# Patient Record
Sex: Male | Born: 1960 | Hispanic: Yes | Marital: Married | Smoking: Never smoker
Health system: Southern US, Community
[De-identification: ages and names within clinical notes are randomized; demographics above are authoritative.]

---

## 2013-05-06 ENCOUNTER — Inpatient Hospital Stay: Payer: Self-pay | Admitting: Internal Medicine

## 2013-05-06 LAB — URINALYSIS, COMPLETE
BILIRUBIN, UR: NEGATIVE
Glucose,UR: NEGATIVE mg/dL (ref 0–75)
KETONE: NEGATIVE
NITRITE: NEGATIVE
Ph: 5 (ref 4.5–8.0)
Protein: 100
SPECIFIC GRAVITY: 1.019 (ref 1.003–1.030)
WBC UR: 1742 /HPF (ref 0–5)

## 2013-05-06 LAB — CBC WITH DIFFERENTIAL/PLATELET
BASOS ABS: 0 10*3/uL (ref 0.0–0.1)
Basophil %: 0.2 %
Eosinophil #: 0 10*3/uL (ref 0.0–0.7)
Eosinophil %: 0.1 %
HCT: 54.9 % — AB (ref 40.0–52.0)
HGB: 18.5 g/dL — AB (ref 13.0–18.0)
Lymphocyte #: 1.4 10*3/uL (ref 1.0–3.6)
Lymphocyte %: 10.3 %
MCH: 29.6 pg (ref 26.0–34.0)
MCHC: 33.8 g/dL (ref 32.0–36.0)
MCV: 88 fL (ref 80–100)
MONOS PCT: 2.2 %
Monocyte #: 0.3 x10 3/mm (ref 0.2–1.0)
Neutrophil #: 11.6 10*3/uL — ABNORMAL HIGH (ref 1.4–6.5)
Neutrophil %: 87.2 %
Platelet: 130 10*3/uL — ABNORMAL LOW (ref 150–440)
RBC: 6.26 10*6/uL — ABNORMAL HIGH (ref 4.40–5.90)
RDW: 13 % (ref 11.5–14.5)
WBC: 13.3 10*3/uL — AB (ref 3.8–10.6)

## 2013-05-06 LAB — CLOSTRIDIUM DIFFICILE(ARMC)

## 2013-05-06 LAB — CK TOTAL AND CKMB (NOT AT ARMC): CK, TOTAL: 66 U/L

## 2013-05-06 LAB — COMPREHENSIVE METABOLIC PANEL
ALBUMIN: 3.8 g/dL (ref 3.4–5.0)
ALK PHOS: 106 U/L
AST: 27 U/L (ref 15–37)
Anion Gap: 11 (ref 7–16)
BUN: 16 mg/dL (ref 7–18)
Bilirubin,Total: 3.4 mg/dL — ABNORMAL HIGH (ref 0.2–1.0)
Calcium, Total: 9 mg/dL (ref 8.5–10.1)
Chloride: 101 mmol/L (ref 98–107)
Co2: 19 mmol/L — ABNORMAL LOW (ref 21–32)
Creatinine: 1.5 mg/dL — ABNORMAL HIGH (ref 0.60–1.30)
EGFR (Non-African Amer.): 53 — ABNORMAL LOW
Glucose: 171 mg/dL — ABNORMAL HIGH (ref 65–99)
OSMOLALITY: 268 (ref 275–301)
POTASSIUM: 3.6 mmol/L (ref 3.5–5.1)
SGPT (ALT): 50 U/L (ref 12–78)
Sodium: 131 mmol/L — ABNORMAL LOW (ref 136–145)
TOTAL PROTEIN: 8.5 g/dL — AB (ref 6.4–8.2)

## 2013-05-06 LAB — PROTIME-INR
INR: 1.2
Prothrombin Time: 14.7 secs (ref 11.5–14.7)

## 2013-05-06 LAB — MAGNESIUM: MAGNESIUM: 1.3 mg/dL — AB

## 2013-05-06 LAB — TROPONIN I

## 2013-05-06 LAB — OCCULT BLOOD X 1 CARD TO LAB, STOOL: OCCULT BLOOD, FECES: NEGATIVE

## 2013-05-06 LAB — HEMOGLOBIN A1C: Hemoglobin A1C: 5.5 % (ref 4.2–6.3)

## 2013-05-07 LAB — CBC WITH DIFFERENTIAL/PLATELET
BASOS ABS: 0 10*3/uL (ref 0.0–0.1)
BASOS PCT: 0.3 %
EOS ABS: 0 10*3/uL (ref 0.0–0.7)
Eosinophil %: 0.1 %
HCT: 42.6 % (ref 40.0–52.0)
HGB: 15 g/dL (ref 13.0–18.0)
Lymphocyte #: 1.1 10*3/uL (ref 1.0–3.6)
Lymphocyte %: 8.7 %
MCH: 30.9 pg (ref 26.0–34.0)
MCHC: 35.3 g/dL (ref 32.0–36.0)
MCV: 88 fL (ref 80–100)
MONO ABS: 0.6 x10 3/mm (ref 0.2–1.0)
Monocyte %: 5 %
NEUTROS PCT: 85.9 %
Neutrophil #: 10.8 10*3/uL — ABNORMAL HIGH (ref 1.4–6.5)
Platelet: 76 10*3/uL — ABNORMAL LOW (ref 150–440)
RBC: 4.86 10*6/uL (ref 4.40–5.90)
RDW: 13 % (ref 11.5–14.5)
WBC: 12.5 10*3/uL — AB (ref 3.8–10.6)

## 2013-05-07 LAB — BASIC METABOLIC PANEL
Anion Gap: 9 (ref 7–16)
BUN: 9 mg/dL (ref 7–18)
Calcium, Total: 7.7 mg/dL — ABNORMAL LOW (ref 8.5–10.1)
Chloride: 108 mmol/L — ABNORMAL HIGH (ref 98–107)
Co2: 18 mmol/L — ABNORMAL LOW (ref 21–32)
Creatinine: 0.94 mg/dL (ref 0.60–1.30)
EGFR (Non-African Amer.): >60
GLUCOSE: 98 mg/dL (ref 65–99)
OSMOLALITY: 269 (ref 275–301)
Potassium: 3.6 mmol/L (ref 3.5–5.1)
SODIUM: 135 mmol/L — AB (ref 136–145)

## 2013-05-07 LAB — MAGNESIUM: MAGNESIUM: 1.9 mg/dL

## 2013-05-07 LAB — WBCS, STOOL

## 2013-05-08 LAB — URINE CULTURE

## 2013-05-08 LAB — PLATELET COUNT: Platelet: 75 10*3/uL — ABNORMAL LOW (ref 150–440)

## 2013-05-09 LAB — CULTURE, BLOOD (SINGLE)

## 2013-05-10 LAB — CULTURE, BLOOD (SINGLE)

## 2013-05-10 LAB — STOOL CULTURE

## 2013-05-12 LAB — CULTURE, BLOOD (SINGLE)

## 2013-07-22 LAB — CBC AND DIFFERENTIAL
HCT: 50 % (ref 41–53)
HEMOGLOBIN: 17.2 g/dL (ref 13.5–17.5)
NEUTROS ABS: 3 /uL
Platelets: 233 10*3/uL (ref 150–399)
WBC: 6.3 10^3/mL

## 2013-11-10 ENCOUNTER — Ambulatory Visit: Payer: Self-pay | Admitting: Chiropractic Medicine

## 2014-07-04 NOTE — Discharge Summary (Signed)
PATIENT NAME:  Tana FeltsBARRERA SEGURA, Markese MR#:  [id number] DATE OF BIRTH:  07-Feb-1961  DATE OF ADMISSION:  05/06/2013 DATE OF DISCHARGE:  05/09/2013  ADMISSION DIAGNOSIS:  Diarrhea.   DISCHARGE DIAGNOSES: 1.  Sepsis secondary to Enterobacter bacteremia and urinary tract infection.  2.  Urinary tract infection.  3.  Diarrhea, resolved.  Negative Clostridium difficile.  4.  Hyperglycemia, resolved.  5.  Thrombocytopenia secondary to sepsis.  PERTINENT LABORATORY:   1.  Urine culture is positive for Enterobacter.  2.  Blood culture is Positive for Enterobacter.  3.  Discharge white blood cell count 12.5, hemoglobin 15, hematocrit 42, platelets 75.    HOSPITAL COURSE:  A 54 year old male who was admitted for diarrhea, found to have bacteremia.  For further details, please refer to the H and P.  1.  Sepsis secondary to Enterobacter bacteremia presumed from his urinary tract infection.  Urinalysis and urine culture was positive for Enterobacter.  His blood cultures that were obtained on admission were also positive for Enterobacter.  The patient was started on broad-spectrum antibiotics.  His Enterobacter is actually sensitive to Levaquin, so he will be discharged with Levaquin.  2.  UTI.  The patient was started on Levaquin and Zosyn.  His urine culture was positive for Enterobacter.  His renal ultrasound was negative.  3.  Diarrhea.  C. diff. was negative.  This is resolved.  4.  Acute renal failure secondary to dehydration, improved.  His renal ultrasound was negative for hydronephrosis.  5.  Hyperglycemia.  On admission his A1c was 5.5.  6.  Thrombocytopenia, likely secondary to severe sepsis.  His platelets should be followed up as an outpatient.   DISCHARGE MEDICATIONS:  Levaquin 500 mg daily for 11 days.   DISCHARGE DIET:  Regular diet.   DISCHARGE ACTIVITY:  As tolerated.   DISCHARGE FOLLOW-UP:  The patient will need to follow up with [facility].  He already has this scheduled  next week.  The patient will need a repeat CBC.   TIME SPENT:  35 minutes.   The patient is medically stable for discharge.   ____________________________ Aissatou Fronczak P. Juliene PinaMody, MD spm:ea D: 05/09/2013 13:36:04 ET T: 05/09/2013 15:21:54 ET JOB#: [id number]  cc: Dondi Aime P. Juliene PinaMody, MD, <Dictator> [facility] Perel Hauschild P Elisabet Gutzmer MD ELECTRONICALLY SIGNED 05/11/2013 21:16

## 2014-07-04 NOTE — H&P (Signed)
PATIENT NAME:  William Potts, William Potts MR#:  [id number] DATE OF BIRTH:  10-Jun-1960  DATE OF ADMISSION:  05/06/2013  PRIMARY CARE PHYSICIAN: None.   CHIEF COMPLAINT: Diarrhea.   HISTORY OF PRESENT ILLNESS:  This is a very pleasant 54 year old male with no past medical history who presents with diarrhea. The patient says since yesterday he has had a lot of diarrhea, almost every half hour. No blood in the diarrhea. No nausea or vomiting. He has had fever and chills as well. He also acknowledges polyuria and frequency. In the ER, he was hypotensive, has responded with 3 liters of fluid. His systolic blood pressure is now 96. He continues to have ongoing diarrhea.   REVIEW OF SYSTEMS: CONSTITUTIONAL: Positive fever. Positive weakness. Positive chills. Positive fatigue.  EYES:  No blurred or double vision.  No glaucoma or cataracts.   EARS, NOSE, THROAT: No ear pain, hearing loss, tinnitus, seasonal allergies.   RESPIRATORY:  No coughing, wheezing, hemoptysis, COPD.  CARDIOVASCULAR:  No chest pain, orthopnea, edema, arrhythmia, dyspnea on exertion, palpitations.  GASTROINTESTINAL:  No nausea, vomiting. Positive diarrhea. No abdominal pain, melena or ulcers.  GENITOURINARY: No dysuria. Positive frequency and polyuria.  ENDOCRINE: Positive polyuria. No nocturia. No thyroid problems.  HEMATOLOGIC AND LYMPHATIC: No anemia or easy bruising.  SKIN: No rash or lesions.  MUSCULOSKELETAL:  No back pain, no limited activity.  NEUROLOGIC:  No history of CVA, TIA or seizure.  PSYCHIATRIC: No history of anxiety or depression  PAST MEDICAL HISTORY:  None.   ALLERGIES: No known drug allergies.   MEDICATIONS:  None.  PAST SURGICAL HISTORY: None.   SOCIAL HISTORY:  No tobacco, alcohol or IV drug.   FAMILY HISTORY:  No history of hypertension or CAD.   PHYSICAL EXAMINATION: VITAL SIGNS: Temperature 102.9, pulse is 126, respirations 22, blood pressure 84/64, is now 96/72, 95% on room air.  GENERAL:  The patient is alert, oriented, does not appear in acute distress.  HEENT;  Head is atraumatic. Pupils are round and reactive. Sclerae anicteric.  Mucous membranes are very dry. Oropharynx is clear.  NECK: Supple without JVD, carotid bruit or enlarged thyroid.  CARDIOVASCULAR: Tachycardia without murmur, gallops or rubs. PMI is not displaced.  LUNGS: Clear to auscultation without crackles, rales, rhonchi or wheezing. Normal to percussion.   ABDOMEN: Bowel sounds are positive. Nontender. No rebound or guarding. No hepatosplenomegaly.  EXTREMITIES:  No clubbing, cyanosis or edema.   NEUROLOGIC:  Cranial nerves II through XII are intact with no focal deficits.   SKIN:  Without rash or lesion.    LABORATORY, DIAGNOSTIC AND RADIOLOGICAL DATA:  1.  Sodium 131, potassium 3.6, chloride 101, bicarb 19, BUN 15, creatinine 1.50, glucose 171, calcium 9.0, bilirubin 3.4, alk phos 106, ALT 50, AST 27, total protein 8.5, albumin 3.8, magnesium 1.3.  2.  White blood cells 13.3, hemoglobin 18.5, hematocrit 55, platelets 130.  3.  Occult feces is negative.  4.  Urinalysis shows 3+ LCE with 1742 white blood cells, 35 red blood cells.  5.  Chest x-ray shows no acute cardiopulmonary disease.   ASSESSMENT AND PLAN: A 54 year old male who presents with profuse diarrhea.  1.  Sepsis.  The patient is tachycardiac, has leukocytosis, a fever. Sepsis is likely secondary to his diarrhea and/or urinary tract infection. We will treat with antibiotics, IV fluids and monitor heart rate, blood pressure. Blood cultures and urine cultures have been ordered in the ER.   2.  Diarrhea. Clostridium difficile  cultures have been ordered.  I have empirically placed the patient on Rocephin and Flagyl. I have chosen Rocephin because the patient has a urinary tract infection as well and I think it Rocephin is a better agent for a urinary tract infection.  3.  Acute renal failure secondary to dehydration secondary to diarrhea and sepsis. We  will provide IV fluids, monitor the creatinine.  4.  Hyponatremia from dehydration from his diarrhea. Will provide IV fluids. Recheck a sodium in the a.m.  5.  Hyperglycemia. The patient may have undiagnosed diabetes as he is complaining of polyuria. Will go ahead and check a hemoglobin A1c. Further management after the A1c is back.  6.  Hypomagnesemia. Will replete and recheck in the a.m.  7.  The patient will need a primary care physician prior to discharge.   CRITICAL CARE TIME SPENT ON THIS PATIENT: Is 45 minutes.   ____________________________ Donell Beers. Benjie Karvonen, MD spm:cs D: 05/06/2013 14:35:32 ET T: 05/06/2013 14:54:02 ET JOB#: [id number]  cc: Deshanta Lady P. Benjie Karvonen, MD, <Dictator> Donell Beers Pj Zehner MD ELECTRONICALLY SIGNED 05/06/2013 17:45

## 2015-02-05 IMAGING — US US RENAL KIDNEY
1 series · 14 of 25 positions shown · non-contrast
Comparison: None.

CLINICAL DATA: UTI

EXAM:
RENAL/URINARY TRACT ULTRASOUND COMPLETE

[Series 1: us renal kidney · 0.27mm/px · 14 of 45 slices shown]
[im 1/45]
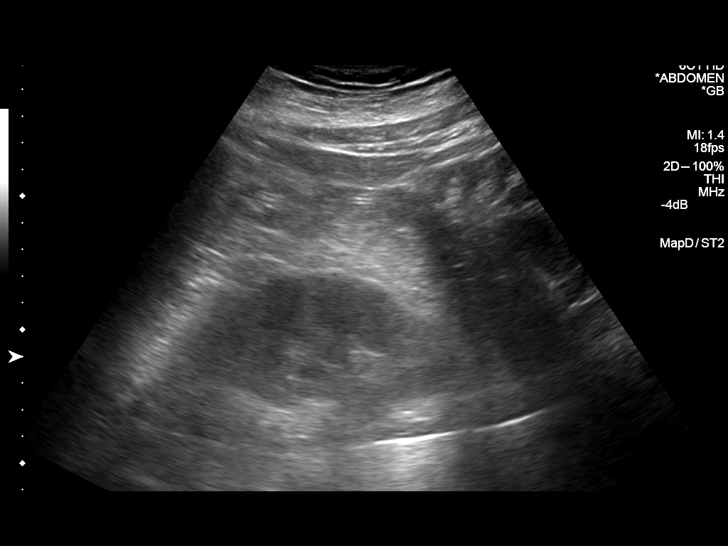
[im 4/45]
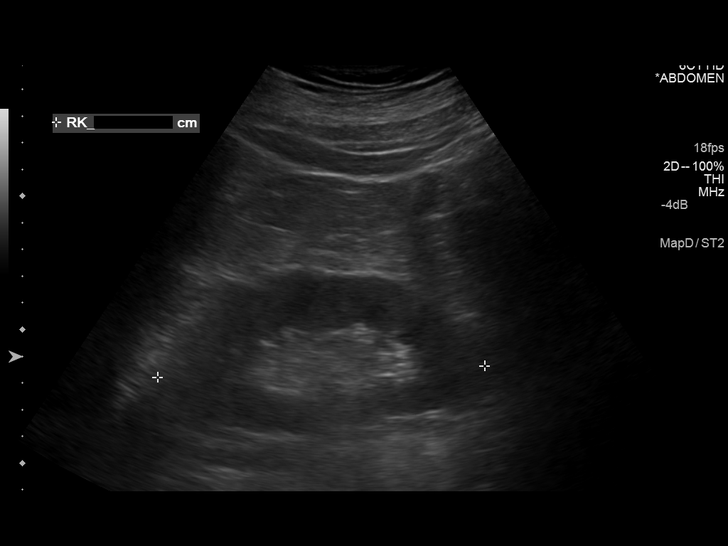
[im 8/45]
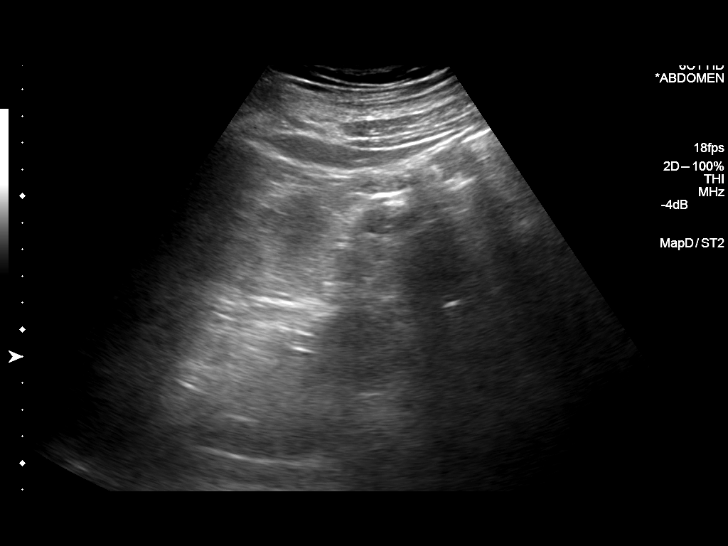
[im 12/45]
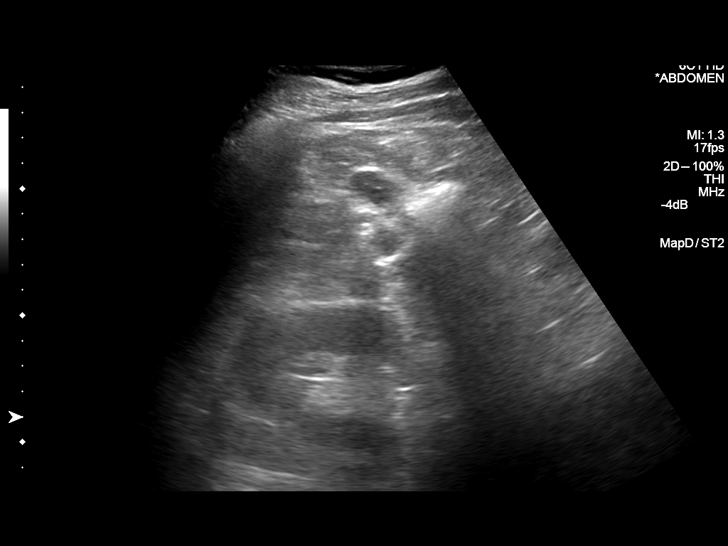
[im 15/45]
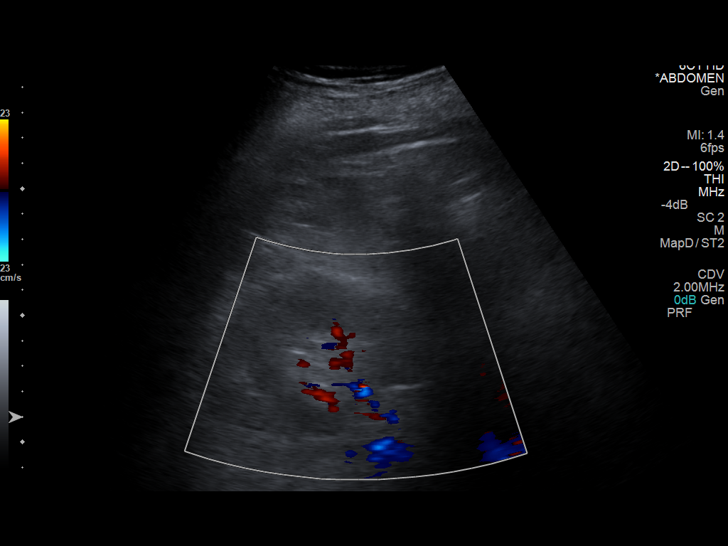
[im 17/45]
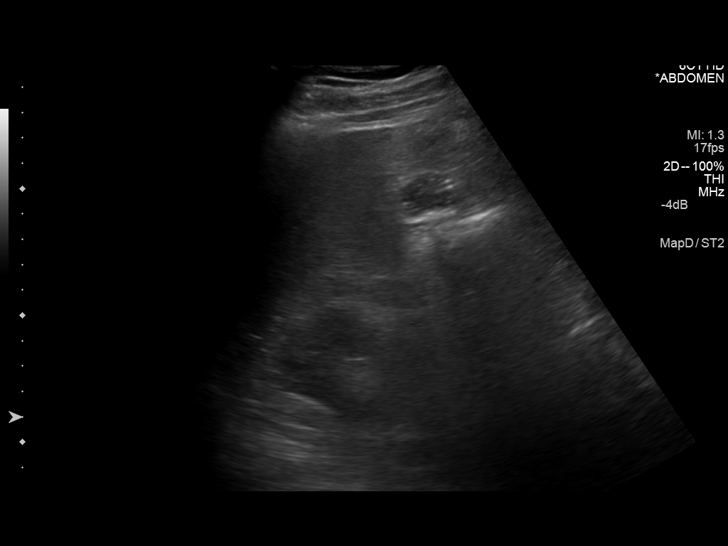
[im 21/45]
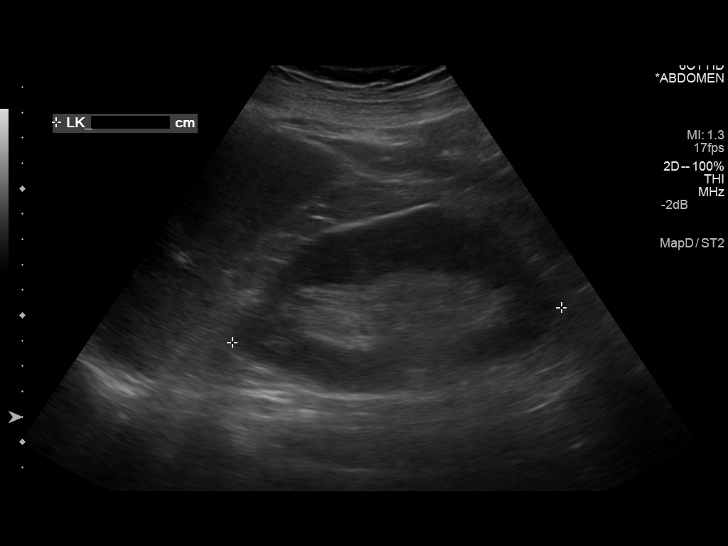
[im 24/45]
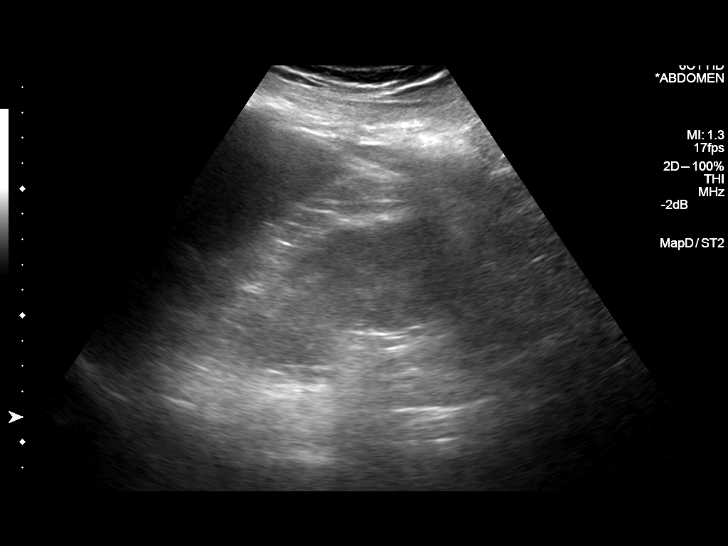
[im 28/45]
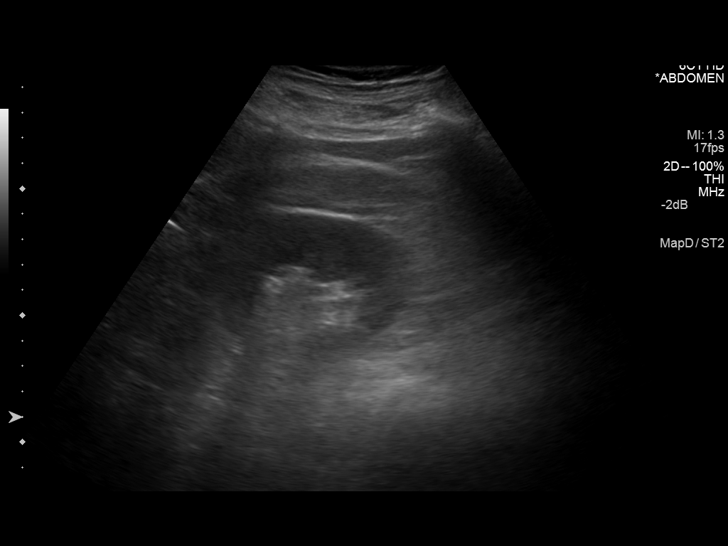
[im 30/45]
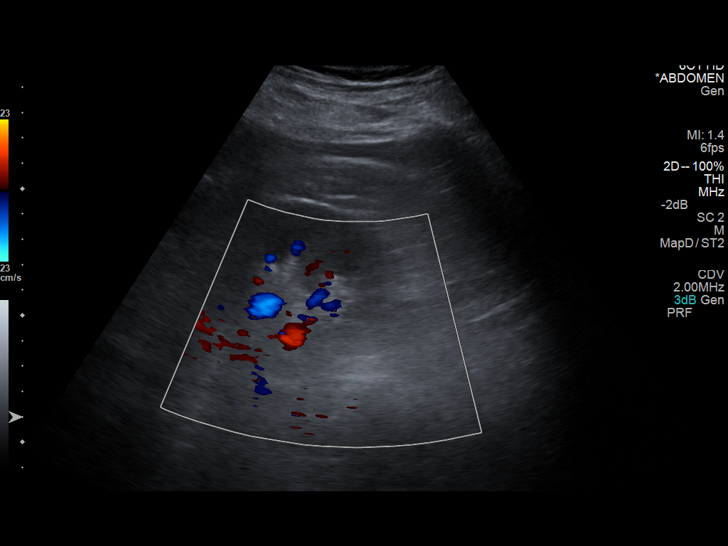
[im 34/45]
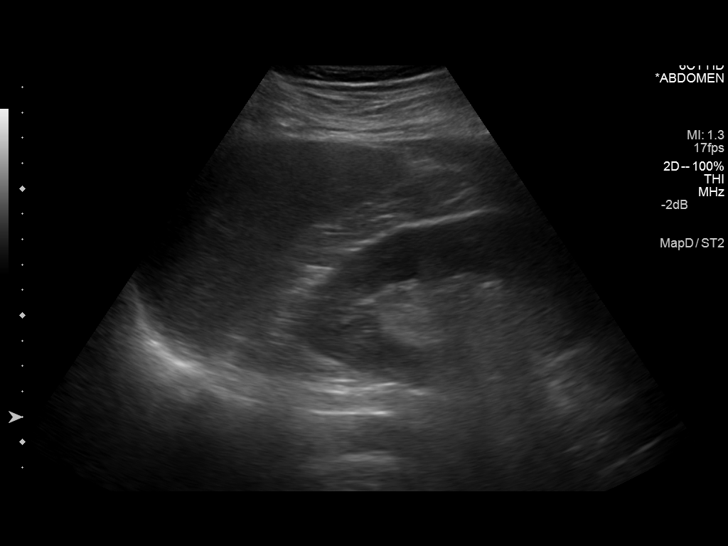
[im 37/45]
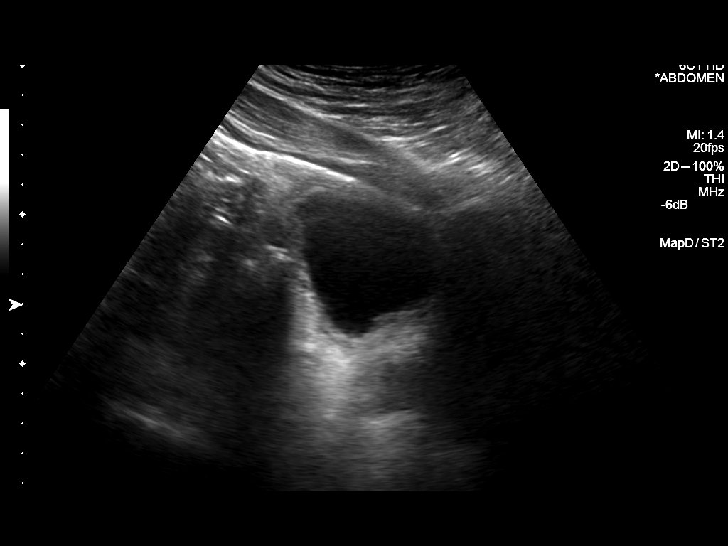
[im 41/45]
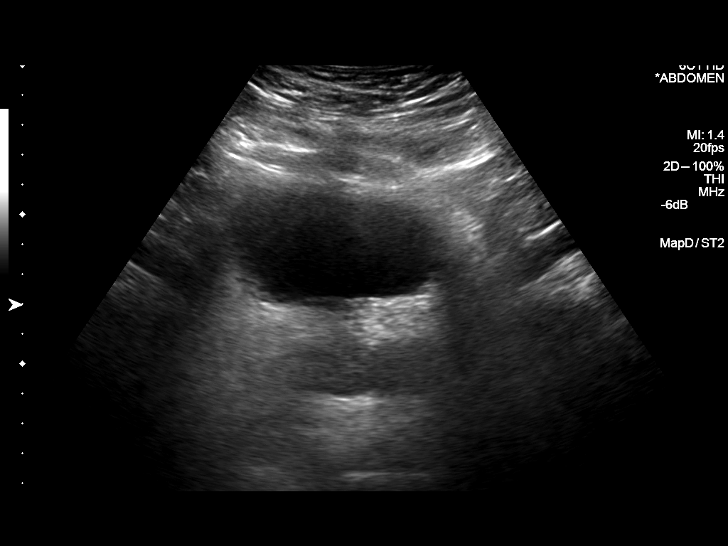
[im 45/45]
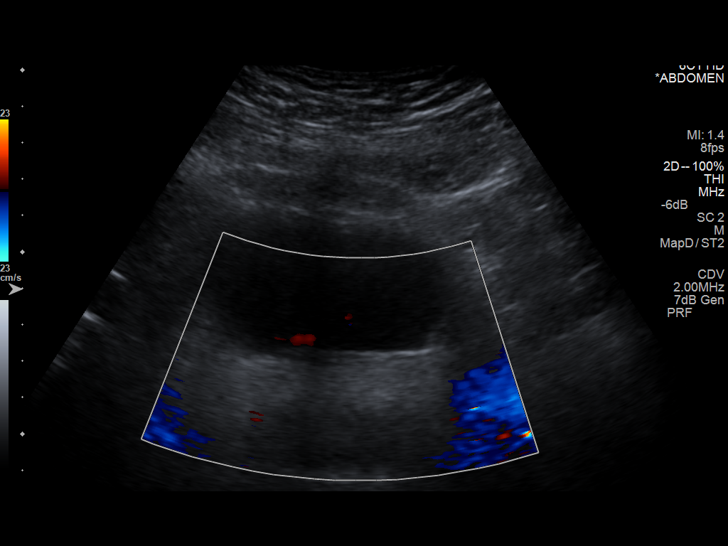

[14 of 25 positions shown; findings below may reference images not displayed]

FINDINGS: Right Kidney:

Length: 12.3 cm.  No mass or hydronephrosis.

Left Kidney:

Length: 13.1 cm.  No mass or hydronephrosis.

Bladder:

Mildly thick-walled but underdistended.
IMPRESSION: Negative renal ultrasound.

## 2019-08-20 ENCOUNTER — Encounter: Payer: Self-pay | Admitting: Emergency Medicine

## 2019-08-20 ENCOUNTER — Emergency Department
Admission: EM | Admit: 2019-08-20 | Discharge: 2019-08-20 | Disposition: A | Discharge status: Home / Self Care | Payer: Self-pay | Attending: Emergency Medicine | Admitting: Emergency Medicine

## 2019-08-20 ENCOUNTER — Other Ambulatory Visit: Payer: Self-pay

## 2019-08-20 ENCOUNTER — Emergency Department: Payer: Self-pay

## 2019-08-20 DIAGNOSIS — Y929 Unspecified place or not applicable: Secondary | ICD-10-CM | POA: Insufficient documentation

## 2019-08-20 DIAGNOSIS — Y939 Activity, unspecified: Secondary | ICD-10-CM | POA: Insufficient documentation

## 2019-08-20 DIAGNOSIS — S61012A Laceration without foreign body of left thumb without damage to nail, initial encounter: Secondary | ICD-10-CM | POA: Insufficient documentation

## 2019-08-20 DIAGNOSIS — W312XXA Contact with powered woodworking and forming machines, initial encounter: Secondary | ICD-10-CM | POA: Insufficient documentation

## 2019-08-20 DIAGNOSIS — S62522B Displaced fracture of distal phalanx of left thumb, initial encounter for open fracture: Secondary | ICD-10-CM | POA: Insufficient documentation

## 2019-08-20 DIAGNOSIS — Y999 Unspecified external cause status: Secondary | ICD-10-CM | POA: Insufficient documentation

## 2019-08-20 MED ORDER — CEPHALEXIN 500 MG PO CAPS: 500.0000 mg | ORAL_CAPSULE | Freq: Three times a day (TID) | ORAL | 0 refills | Status: AC

## 2019-08-20 MED ORDER — OXYCODONE-ACETAMINOPHEN 7.5-325 MG PO TABS: 1.0000 | ORAL_TABLET | Freq: Four times a day (QID) | ORAL | 0 refills | Status: AC | PRN

## 2019-08-20 MED ORDER — OXYCODONE-ACETAMINOPHEN 5-325 MG PO TABS
1.0000 | ORAL_TABLET | Freq: Once | ORAL | Status: AC
  Administered 2019-08-20: 1 via ORAL
  Filled 2019-08-20: qty 1

## 2019-08-20 MED ORDER — BUPIVACAINE HCL (PF) 0.5 % IJ SOLN
20.0000 mL | Freq: Once | INTRAMUSCULAR | Status: AC
  Administered 2019-08-20: 20 mL
  Filled 2019-08-20: qty 20

## 2019-08-20 MED ORDER — ONDANSETRON 4 MG PO TBDP
4.0000 mg | ORAL_TABLET | Freq: Once | ORAL | Status: AC
  Administered 2019-08-20: 4 mg via ORAL
  Filled 2019-08-20: qty 1

## 2019-08-20 MED ORDER — CEFAZOLIN SODIUM-DEXTROSE 1-4 GM/50ML-% IV SOLN
1.0000 g | Freq: Once | INTRAVENOUS | Status: AC
  Administered 2019-08-20: 1 g via INTRAVENOUS
  Filled 2019-08-20 (×2): qty 50

## 2019-08-20 NOTE — ED Triage Notes (Signed)
Patient presents to the ED with a cut to his left thumb.  Patient states he cut himself with a saw accidentally while attempting to cut wood.  Patient is wearing a glove on his left hand which he is currently unable to get off due to the pain to his thumb.  Patient states injury occurred approx. 20 min ago.

## 2019-08-20 NOTE — Discharge Instructions (Signed)
Call [facility] orthopedic department tomorrow for an appointment.  Keep the area clean and dry.  Elevate your hand to reduce swelling and help with pain.  Take the antibiotic as directed until completely finished and the pain medication may make you sleepy therefore you cannot drive or operate machinery while taking this medication.  It is extremely important that you keep this clean and dry to prevent infection getting into the bone.  You will need to remain out of work until the orthopedist has seen you.

## 2019-08-20 NOTE — ED Notes (Signed)
Irregular shaped laceration to left thumb. Unable to flex thumb at distal joint. Cut with table saw. Bleeding controlled at this time.

## 2019-08-20 NOTE — ED Provider Notes (Signed)
[facility] Emergency Department Provider Note   ____________________________________________   First MD Initiated Contact with Patient 08/20/19 1620     (approximate)  I have reviewed the triage vital signs and the nursing notes.   HISTORY  Chief Complaint Laceration Per Spanish interpreter    HPI William Potts is a 59 y.o. male Modena Jansky to the ED with a laceration to his left thumb.  Patient states he was using a table saw and cut himself accidentally.  Patient was wearing gloves at the time.  At this time bleeding is controlled.  He states that he is up-to-date on his tetanus.  Patient rates his pain as a 9 out of 10.       History reviewed. No pertinent past medical history.  There are no problems to display for this patient.   History reviewed. No pertinent surgical history.  Prior to Admission medications   Medication Sig Start Date End Date Taking? Authorizing Provider  cephALEXin (KEFLEX) 500 MG capsule Take 1 capsule (500 mg total) by mouth 3 (three) times daily. 08/20/19   Tommi Rumps, PA-C  oxyCODONE-acetaminophen (PERCOCET) 7.5-325 MG tablet Take 1 tablet by mouth every 6 (six) hours as needed for severe pain. 08/20/19 08/19/20  Tommi Rumps, PA-C    Allergies Patient has no known allergies.  Family History  Problem Relation Age of Onset  . Heart attack Maternal Grandfather     Social History Social History   Tobacco Use  . Smoking status: Never Smoker  . Smokeless tobacco: Never Used  Substance Use Topics  . Alcohol use: Not Currently  . Drug use: Not on file    Review of Systems Constitutional: No fever/chills Cardiovascular: Denies chest pain. Respiratory: Denies shortness of breath. Gastrointestinal:   No nausea, no vomiting.   Musculoskeletal: Positive left thumb pain. Skin: Positive laceration left thumb. Neurological: Negative for  headaches  ____________________________________________   PHYSICAL EXAM:  VITAL SIGNS: ED Triage Vitals  Enc Vitals Group     BP 08/20/19 1550 138/80     Pulse Rate 08/20/19 1550 (!) 109     Resp 08/20/19 1550 16     Temp 08/20/19 1550 98.7 F (37.1 C)     Temp Source 08/20/19 1550 Oral     SpO2 08/20/19 1550 94 %     Weight 08/20/19 1546 220 lb (99.8 kg)     Height 08/20/19 1546 5' 8.5" (1.74 m)     Head Circumference --      Peak Flow --      Pain Score 08/20/19 1546 9     Pain Loc --      Pain Edu? --      Excl. in [place]? --     Constitutional: Alert and oriented. Well appearing and in no acute distress. Eyes: Conjunctivae are normal.  Head: Atraumatic. Neck: No stridor.   Cardiovascular: Normal rate, regular rhythm. Grossly normal heart sounds.  Good peripheral circulation. Respiratory: Normal respiratory effort.  No retractions. Lungs CTAB. Gastrointestinal: Soft and nontender. No distention.  Musculoskeletal: Left thumb with irregular laceration on the volar surface near the distal phalanx.  Range of motion is restricted secondary to pain but patient is able to flex and extend minimally without increased pain.  Motor sensory function intact.  Capillary refills less than 3 seconds.  Bleeding is under control and no foreign bodies were noted. Neurologic:  Normal speech and language. No gross focal neurologic deficits are appreciated.  Skin:  Skin  is warm, dry and intact. No rash noted. Psychiatric: Mood and affect are normal. Speech and behavior are normal.  ____________________________________________   LABS (all labs ordered are listed, but only abnormal results are displayed)  Labs Reviewed - No data to display ____________________________________________  RADIOLOGY   Official radiology report(s): DG Finger Thumb Left  Result Date: 08/20/2019 CLINICAL DATA:  Left thumb laceration EXAM: LEFT THUMB 2+V COMPARISON:  None. FINDINGS: Frontal, oblique, lateral views  of the left thumb are obtained. There is a large laceration volar aspect distal margin first digit. There is a linear defect within the first distal phalanx, consistent with fracture from table saw injury. No radiopaque foreign bodies. IMPRESSION: 1. Open fracture of the first distal phalanx with large overlying laceration. Electronically Signed   By: Sharlet Salina M.D.   On: 08/20/2019 16:43    ____________________________________________   PROCEDURES  Procedure(s) performed (including Critical Care):  Marland KitchenMarland KitchenLaceration Repair  Date/Time: 08/20/2019 5:37 PM Performed by: Tommi Rumps, PA-C Authorized by: Tommi Rumps, PA-C   Consent:    Consent obtained:  Verbal   Consent given by:  Patient   Risks discussed:  Pain, infection and poor wound healing Anesthesia (see MAR for exact dosages):    Anesthesia method:  Nerve block   Block location:  Left thumb   Block needle gauge:  25 G   Block anesthetic:  Bupivacaine 0.5% w/o epi   Block injection procedure:  Introduced needle, anatomic landmarks identified, negative aspiration for blood and incremental injection   Block outcome:  Anesthesia achieved Laceration details:    Location:  Finger   Finger location:  L thumb   Length (cm):  3 Repair type:    Repair type:  Intermediate Pre-procedure details:    Preparation:  Patient was prepped and draped in usual sterile fashion and imaging obtained to evaluate for foreign bodies Exploration:    Hemostasis achieved with:  Direct pressure   Wound exploration: wound explored through full range of motion     Contaminated: no   Treatment:    Area cleansed with:  Shur-Clens and saline   Amount of cleaning:  Extensive   Irrigation solution:  Sterile saline   Irrigation volume:  200 ml   Irrigation method:  Pressure wash and syringe   Visualized foreign bodies/material removed: no   Skin repair:    Repair method:  Sutures   Suture size:  4-0   Suture material:  Nylon   Suture  technique:  Simple interrupted   Number of sutures:  9 Approximation:    Approximation:  Loose Post-procedure details:    Dressing:  Non-adherent dressing and sterile dressing   Patient tolerance of procedure:  Tolerated well, no immediate complications Comments:     Interpreter was called and debridement and tissue loss due to the table saw was explained to the patient.  There were areas where it was loosely sutured due to loss of tissue.  Patient confirms that he is not having any pain while suturing.  Patient has slightly increased flexion and extension after digital block as his pain is under control.     ____________________________________________   INITIAL IMPRESSION / ASSESSMENT AND PLAN / ED COURSE  As part of my medical decision making, I reviewed the following data within the electronic MEDICAL RECORD NUMBER Notes from prior ED visits and [place] Controlled Substance Database  59 year old male presents to the ED with a laceration to his left thumb with a table saw that happened at home.  Patient reports that he is up-to-date on his tetanus immunization.  Patient is left-hand dominant.  Patient also has a fracture of the distal phalanx along with an open wound with bleeding controlled.  With the help of the interpreter all of this was explained to the patient as well as how important it is to keep this clean and dry and that it is considered an open fracture.  He was given Ancef 1 g IV.  Patient is aware that he will have antibiotics and pain medication at his pharmacy that he will need to take.  He is to call the orthopedist tomorrow to make an appointment.  ____________________________________________   FINAL CLINICAL IMPRESSION(S) / ED DIAGNOSES  Final diagnoses:  Laceration of left thumb without foreign body without damage to nail, initial encounter  Open displaced fracture of distal phalanx of left thumb, initial encounter     ED Discharge Orders         Ordered     oxyCODONE-acetaminophen (PERCOCET) 7.5-325 MG tablet  Every 6 hours PRN     08/20/19 1853    cephALEXin (KEFLEX) 500 MG capsule  3 times daily     08/20/19 1853           Note:  This document was prepared using Dragon voice recognition software and may include unintentional dictation errors.    Johnn Hai, PA-C 08/20/19 1910    Duffy Bruce, MD 08/25/19 1328

## 2021-04-18 ENCOUNTER — Emergency Department
Admission: EM | Admit: 2021-04-18 | Discharge: 2021-04-18 | Disposition: A | Discharge status: Home / Self Care | Payer: Self-pay | Attending: Emergency Medicine | Admitting: Emergency Medicine

## 2021-04-18 ENCOUNTER — Other Ambulatory Visit: Payer: Self-pay

## 2021-04-18 ENCOUNTER — Ambulatory Visit
Admission: RE | Admit: 2021-04-18 | Discharge: 2021-04-18 | Payer: Self-pay | Source: Ambulatory Visit | Attending: Emergency Medicine | Admitting: Emergency Medicine

## 2021-04-18 ENCOUNTER — Emergency Department: Payer: Self-pay

## 2021-04-18 VITALS — BP 138/81 | HR 85 | Temp 98.4°F | Resp 18

## 2021-04-18 DIAGNOSIS — R5383 Other fatigue: Secondary | ICD-10-CM

## 2021-04-18 DIAGNOSIS — R3589 Other polyuria: Secondary | ICD-10-CM

## 2021-04-18 DIAGNOSIS — R631 Polydipsia: Secondary | ICD-10-CM

## 2021-04-18 DIAGNOSIS — R739 Hyperglycemia, unspecified: Secondary | ICD-10-CM

## 2021-04-18 DIAGNOSIS — E139 Other specified diabetes mellitus without complications: Secondary | ICD-10-CM

## 2021-04-18 DIAGNOSIS — E1165 Type 2 diabetes mellitus with hyperglycemia: Secondary | ICD-10-CM | POA: Insufficient documentation

## 2021-04-18 DIAGNOSIS — Z20822 Contact with and (suspected) exposure to covid-19: Secondary | ICD-10-CM | POA: Insufficient documentation

## 2021-04-18 DIAGNOSIS — Z794 Long term (current) use of insulin: Secondary | ICD-10-CM | POA: Insufficient documentation

## 2021-04-18 DIAGNOSIS — Z7984 Long term (current) use of oral hypoglycemic drugs: Secondary | ICD-10-CM | POA: Insufficient documentation

## 2021-04-18 LAB — HEPATIC FUNCTION PANEL
ALT: 31 U/L (ref 0–44)
AST: 24 U/L (ref 15–41)
Albumin: 3.7 g/dL (ref 3.5–5.0)
Alkaline Phosphatase: 138 U/L — ABNORMAL HIGH (ref 38–126)
Bilirubin, Direct: 0.1 mg/dL (ref 0.0–0.2)
Indirect Bilirubin: 1.2 mg/dL — ABNORMAL HIGH (ref 0.3–0.9)
Total Bilirubin: 1.3 mg/dL — ABNORMAL HIGH (ref 0.3–1.2)
Total Protein: 6.5 g/dL (ref 6.5–8.1)

## 2021-04-18 LAB — RESP PANEL BY RT-PCR (FLU A&B, COVID) ARPGX2
Influenza A by PCR: NEGATIVE
Influenza B by PCR: NEGATIVE
SARS Coronavirus 2 by RT PCR: NEGATIVE

## 2021-04-18 LAB — BETA-HYDROXYBUTYRIC ACID: Beta-Hydroxybutyric Acid: 1.55 mmol/L — ABNORMAL HIGH (ref 0.05–0.27)

## 2021-04-18 LAB — BASIC METABOLIC PANEL
Anion gap: 9 (ref 5–15)
BUN: 17 mg/dL (ref 6–20)
CO2: 24 mmol/L (ref 22–32)
Calcium: 8.8 mg/dL — ABNORMAL LOW (ref 8.9–10.3)
Chloride: 95 mmol/L — ABNORMAL LOW (ref 98–111)
Creatinine, Ser: 0.81 mg/dL (ref 0.61–1.24)
GFR, Estimated: >60 mL/min (ref >60)
Glucose, Bld: 409 mg/dL — ABNORMAL HIGH (ref 70–99)
Potassium: 4.4 mmol/L (ref 3.5–5.1)
Sodium: 128 mmol/L — ABNORMAL LOW (ref 135–145)

## 2021-04-18 LAB — CBC
HCT: 46.4 % (ref 39.0–52.0)
Hemoglobin: 16.4 g/dL (ref 13.0–17.0)
MCH: 30.7 pg (ref 26.0–34.0)
MCHC: 35.3 g/dL (ref 30.0–36.0)
MCV: 86.7 fL (ref 80.0–100.0)
Platelets: 202 10*3/uL (ref 150–400)
RBC: 5.35 MIL/uL (ref 4.22–5.81)
RDW: 11.9 % (ref 11.5–15.5)
WBC: 6 10*3/uL (ref 4.0–10.5)
nRBC: 0 % (ref 0.0–0.2)

## 2021-04-18 LAB — URINALYSIS, ROUTINE W REFLEX MICROSCOPIC
Bacteria, UA: NONE SEEN
Bilirubin Urine: NEGATIVE
Glucose, UA: >1000 mg/dL — AB
Hgb urine dipstick: NEGATIVE
Ketones, ur: 40 mg/dL — AB
Leukocytes,Ua: NEGATIVE
Nitrite: NEGATIVE
Protein, ur: NEGATIVE mg/dL
Specific Gravity, Urine: <1.005 — ABNORMAL LOW (ref 1.005–1.030)
Squamous Epithelial / HPF: NONE SEEN (ref 0–5)
pH: 5 (ref 5.0–8.0)

## 2021-04-18 LAB — CBG MONITORING, ED
Glucose-Capillary: 421 mg/dL — ABNORMAL HIGH (ref 70–99)
Glucose-Capillary: 241 mg/dL — ABNORMAL HIGH (ref 70–99)
Glucose-Capillary: 250 mg/dL — ABNORMAL HIGH (ref 70–99)

## 2021-04-18 LAB — POCT FASTING CBG KUC MANUAL ENTRY: POCT Glucose (KUC): 458 mg/dL — AB (ref 70–99)

## 2021-04-18 MED ORDER — SODIUM CHLORIDE 0.9 % IV BOLUS
1000.0000 mL | Freq: Once | INTRAVENOUS | Status: AC
Start: 2021-04-18 — End: 2021-04-18
  Administered 2021-04-18: 1000 mL via INTRAVENOUS

## 2021-04-18 MED ORDER — LACTATED RINGERS IV BOLUS
1000.0000 mL | Freq: Once | INTRAVENOUS | Status: AC
  Administered 2021-04-18: 1000 mL via INTRAVENOUS

## 2021-04-18 MED ORDER — GLIPIZIDE 5 MG PO TABS: 2.5000 mg | ORAL_TABLET | Freq: Every day | ORAL | 0 refills | Status: AC

## 2021-04-18 NOTE — ED Triage Notes (Signed)
Patient to ER via [facility] with daughter. Patient was advised to be seen in the emergency department after a high cbg reading at urgent care of over 400. Reports several months of polyuria/ polydipsia. Patient also reports fatigue. No history of diabetes.

## 2021-04-18 NOTE — Discharge Instructions (Addendum)
Go to the emergency department for evaluation of your blood sugar of 458.

## 2021-04-18 NOTE — ED Triage Notes (Signed)
Pt presents to the urgent care with his daughter. He c/o urinary frequency, excessive thirst, and fatigue for several months.

## 2021-04-18 NOTE — ED Provider Notes (Signed)
[facility]    CSN: [id number] Arrival date & time: 04/18/21  1643      History   Chief Complaint Chief Complaint  Patient presents with   Urinary Frequency   Polydipsia   Fatigue    HPI William Potts is a 61 y.o. male. Accompanied by his daughter, patient presents with several months of fatigue, excessive thirst, frequent urination.  He denies abdominal pain, dysuria, vomiting, diarrhea, fever, chills, chest pain, shortness of breath, or other symptoms.  No treatments at home.  He denies pertinent medical history.  He does not have a PCP.    The history is provided by a relative and the patient.   History reviewed. No pertinent past medical history.  There are no problems to display for this patient.   History reviewed. No pertinent surgical history.     Home Medications    Prior to Admission medications   Not on File    Family History No family history on file.  Social History Social History   Tobacco Use   Smoking status: Never   Smokeless tobacco: Never  Vaping Use   Vaping Use: Never used  Substance Use Topics   Alcohol use: Not Currently   Drug use: Never     Allergies   Patient has no known allergies.   Review of Systems Review of Systems  Constitutional:  Positive for fatigue. Negative for chills and fever.  Respiratory:  Negative for cough and shortness of breath.   Cardiovascular:  Negative for chest pain and palpitations.  Gastrointestinal:  Negative for abdominal pain, diarrhea, nausea and vomiting.  Endocrine: Positive for polydipsia and polyuria.  Genitourinary:  Positive for frequency. Negative for dysuria and hematuria.  Skin:  Negative for color change and rash.  All other systems reviewed and are negative.   Physical Exam Triage Vital Signs ED Triage Vitals  Enc Vitals Group     BP      Pulse      Resp      Temp      Temp src      SpO2      Weight      Height      Head Circumference      Peak Flow       Pain Score      Pain Loc      Pain Edu?      Excl. in [place]?    No data found.  Updated Vital Signs BP 138/81    Pulse 85    Temp 98.4 F (36.9 C)    Resp 18    SpO2 98%   Visual Acuity Right Eye Distance:   Left Eye Distance:   Bilateral Distance:    Right Eye Near:   Left Eye Near:    Bilateral Near:     Physical Exam Vitals and nursing note reviewed.  Constitutional:      General: He is not in acute distress.    Appearance: Normal appearance. He is well-developed. He is not ill-appearing.  HENT:     Mouth/Throat:     Mouth: Mucous membranes are moist.  Cardiovascular:     Rate and Rhythm: Normal rate and regular rhythm.     Heart sounds: Normal heart sounds.  Pulmonary:     Effort: Pulmonary effort is normal. No respiratory distress.     Breath sounds: Normal breath sounds.  Abdominal:     General: Bowel sounds are normal.     Palpations:  Abdomen is soft.     Tenderness: There is no abdominal tenderness. There is no guarding or rebound.  Musculoskeletal:     Cervical back: Neck supple.  Skin:    General: Skin is warm and dry.     Capillary Refill: Capillary refill takes less than 2 seconds.  Neurological:     Mental Status: He is alert.     Gait: Gait normal.  Psychiatric:        Mood and Affect: Mood normal.        Behavior: Behavior normal.     UC Treatments / Results  Labs (all labs ordered are listed, but only abnormal results are displayed) Labs Reviewed  POCT FASTING CBG [facility] MANUAL ENTRY - Abnormal; Notable for the following components:      Result Value   POCT Glucose ([facility]) 458 (*)    All other components within normal limits    EKG   Radiology No results found.  Procedures Procedures (including critical care time)  Medications Ordered in UC Medications - No data to display  Initial Impression / Assessment and Plan / UC Course  I have reviewed the triage vital signs and the nursing notes.  Pertinent labs & imaging results that were  available during my care of the patient were reviewed by me and considered in my medical decision making (see chart for details).    Hyperglycemia, fatigue, polydipsia, polyuria.  CBG 458.  Patient has been symptomatic for several months.  He does not have a PCP.  Sending him to the ED for evaluation.  He is with his daughter who will take him to the ED.  VSS.    Final Clinical Impressions(s) / UC Diagnoses   Final diagnoses:  Hyperglycemia  Fatigue, unspecified type  Polydipsia  Polyuria     Discharge Instructions      Go to the emergency department for evaluation of your blood sugar of 458.          ED Prescriptions   None    PDMP not reviewed this encounter.   Sharion Balloon, NP 04/18/21 1747

## 2021-04-18 NOTE — ED Notes (Signed)
See triage note for additional details. Pt to ed with daughter for hyperglycemia, pt has had polydipsia and polyuria for the past few months. Denies hx of diabetes. States he also felt weakness since this has occurred.  Denies any blurry vision.

## 2021-04-18 NOTE — Discharge Instructions (Signed)
We are going to start on a very low-dose of glipizide to help with his sugars but this can be uptitrated but there are risk for hypoglycemia therefore we are starting very low.  He should go to the health department to get further work-up for diabetes.  Return to the ER for worsening symptoms or any other concerns

## 2021-04-18 NOTE — ED Provider Notes (Signed)
[facility] Provider Note    Event Date/Time   First MD Initiated Contact with Patient 04/18/21 1833     (approximate)   History   Hyperglycemia   HPI  William Potts is a 61 y.o. male who comes in with his daughter for several months of fatigue, excessive thirst, frequent urination, weight loss he does not have a PCP.  His sugar was noted to be 458 so patient was sent here for evaluation.  Patient's oxygen levels were noted to be slightly low but he denies any shortness of breath, chest pain, abdominal pain, nausea, vomiting.  He reports that this been going on for months but his wife made him come into the ER.  His daughter is at bedside.  They have declined interpreter.  Physical Exam   Triage Vital Signs: ED Triage Vitals  Enc Vitals Group     BP 04/18/21 1809 (!) 140/98     Pulse Rate 04/18/21 1809 86     Resp 04/18/21 1809 18     Temp 04/18/21 1809 98.5 F (36.9 C)     Temp Source 04/18/21 1809 Oral     SpO2 04/18/21 1809 99 %     Weight --      Height --      Head Circumference --      Peak Flow --      Pain Score 04/18/21 1810 0     Pain Loc --      Pain Edu? --      Excl. in GC? --     Most recent vital signs: Vitals:   04/18/21 1809  BP: (!) 140/98  Pulse: 86  Resp: 18  Temp: 98.5 F (36.9 C)  SpO2: 99%     General: Awake, no distress.  CV:  Good peripheral perfusion.  Resp:  Normal effort.  Clear lungs Abd:  No distention.  Nontender Other:  No swelling of the legs.   ED Results / Procedures / Treatments   Labs (all labs ordered are listed, but only abnormal results are displayed) Labs Reviewed  CBG MONITORING, ED - Abnormal; Notable for the following components:      Result Value   Glucose-Capillary 421 (*)    All other components within normal limits  CBC  BASIC METABOLIC PANEL  URINALYSIS, ROUTINE W REFLEX MICROSCOPIC  BETA-HYDROXYBUTYRIC ACID  CBG MONITORING, ED     RADIOLOGY I have reviewed the  xray personally and agree with radiology read no evidence of any pneumonia.  There is some linear atelectasis   PROCEDURES:  Critical Care performed: No  Procedures   MEDICATIONS ORDERED IN ED: Medications  sodium chloride 0.9 % bolus 1,000 mL (0 mLs Intravenous Stopped 04/18/21 1946)  lactated ringers bolus 1,000 mL (0 mLs Intravenous Stopped 04/18/21 2056)     IMPRESSION / MDM / ASSESSMENT AND PLAN / ED COURSE  I reviewed the triage vital signs and the nursing notes.                              Differential diagnosis includes, but is not limited to, hyperglycemia, DKA, UTI.  Patient's oxygen levels were normal in triage but Monitor He Was between 93 to 95%.  Denies Any Chest Pain or Shortness of Breath but I Will Add on a Chest X-Ray and COVID Test.  We will start some IV fluids for the hyperglycemia.   Sugars elevated at 421 and he  is got some ketones noted in his urine and slightly elevated beta hydroxybutyrate but his CO2 and anion gap are normal therefore patient is not acidotic or have evidence of DKA.  Patient was given 2 L of fluids to help with the dehydration and the ketones.  His LFTs are slightly elevated so I do not want to start him on metformin due to the risk therefore we will start some low-dose glipizide.  After 2 L of fluids sugars came down to the 200s.  [facility] are going [facility].  They understand the importance of this.  We discussed return precautions in regards to DKA which would be more nausea and vomiting.  But patient symptoms are the same symptoms he has been having for months therefore this is not representing DKA  I considered admission for patient given hyperglycemia but given no evidence of DKA and patient is well-appearing with similar symptoms for the past few months patient and family feel comfortable with discharge home on glipizide  We did discuss risk of hypoglycemia with the glipizide but I suspect this will not happen  given how elevated he is  We discussed diet as well    FINAL CLINICAL IMPRESSION(S) / ED DIAGNOSES   Final diagnoses:  Other specified diabetes mellitus without complication, without long-term current use of insulin (HCC)     Rx / DC Orders   ED Discharge Orders          Ordered    glipiZIDE (GLUCOTROL) 5 MG tablet  Daily before breakfast        04/18/21 2254             Note:  This document was prepared using Dragon voice recognition software and may include unintentional dictation errors.   Concha Se, MD 04/18/21 2256

## 2021-04-19 ENCOUNTER — Encounter: Payer: Self-pay | Admitting: Emergency Medicine

## 2021-04-19 LAB — HEMOGLOBIN A1C
Hgb A1c MFr Bld: 14.2 % — ABNORMAL HIGH (ref 4.8–5.6)
Mean Plasma Glucose: 360.84 mg/dL

## 2021-05-20 IMAGING — DX DG FINGER THUMB 2+V*L*
3 series · 3 of 3 positions shown · non-contrast
Comparison: None.

CLINICAL DATA: Left thumb laceration

EXAM:
LEFT THUMB 2+V

[finger ap]
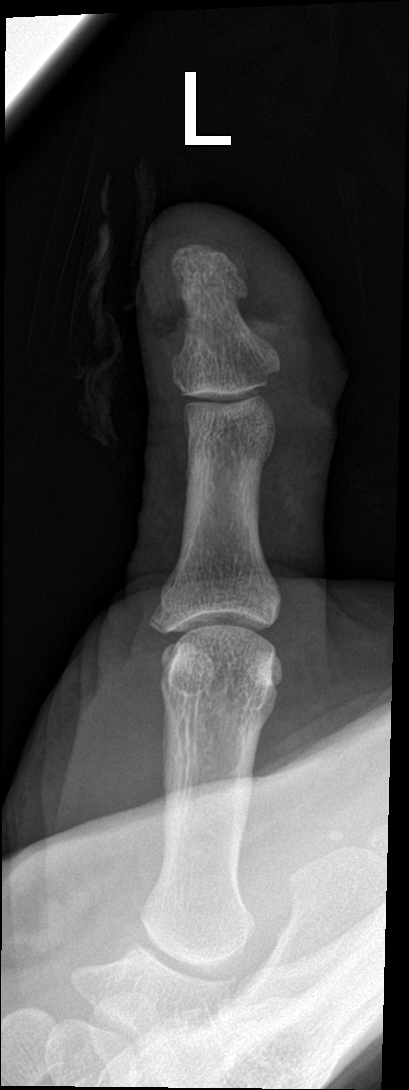

[finger obl]
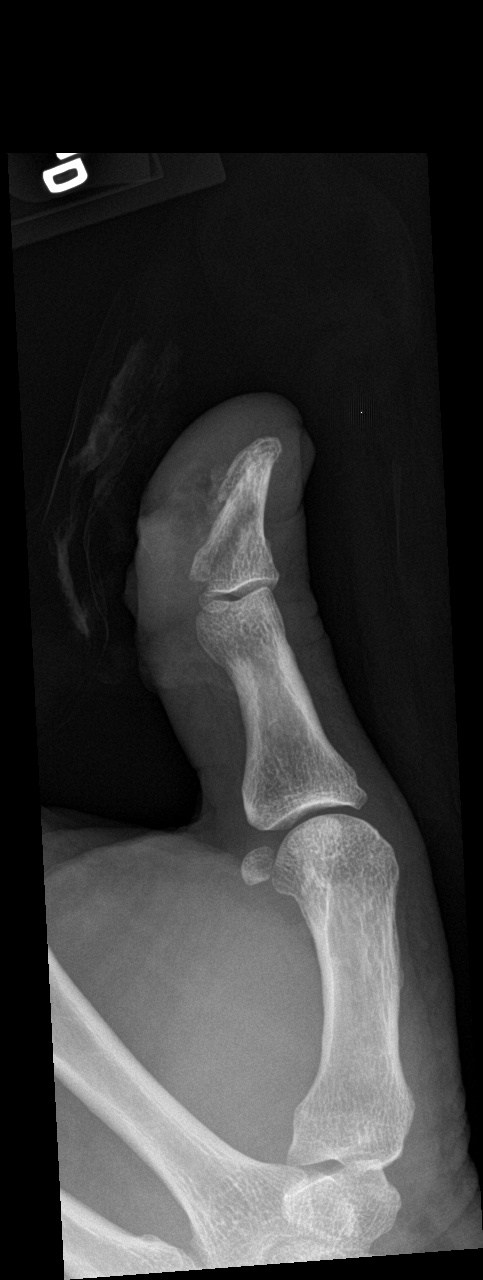

[finger lat]
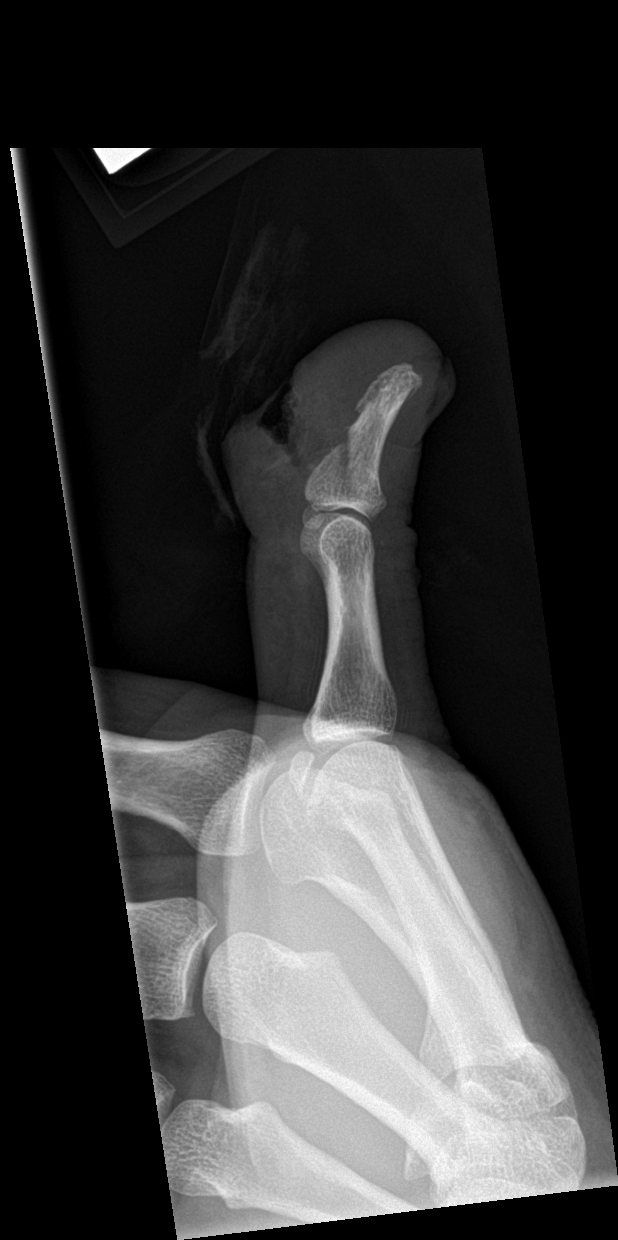

[3 of 3 positions shown; findings below may reference images not displayed]

FINDINGS: Frontal, oblique, lateral views of the left thumb are obtained.
There is a large laceration volar aspect distal margin first digit.
There is a linear defect within the first distal phalanx, consistent
with fracture from table saw injury. No radiopaque foreign bodies.
IMPRESSION: 1. Open fracture of the first distal phalanx with large overlying
laceration.
